# Patient Record
Sex: Female | Born: 1985 | Race: White | Hispanic: No | Marital: Single | Smoking: Current every day smoker
Health system: Southern US, Community
[De-identification: ages and names within clinical notes are randomized; demographics above are authoritative.]

## PROBLEM LIST (undated history)

## (undated) DIAGNOSIS — J45909 Unspecified asthma, uncomplicated: Secondary | ICD-10-CM

## (undated) DIAGNOSIS — I82409 Acute embolism and thrombosis of unspecified deep veins of unspecified lower extremity: Secondary | ICD-10-CM

## (undated) DIAGNOSIS — I10 Essential (primary) hypertension: Secondary | ICD-10-CM

## (undated) DIAGNOSIS — I2699 Other pulmonary embolism without acute cor pulmonale: Secondary | ICD-10-CM

## (undated) HISTORY — PX: OTHER SURGICAL HISTORY: SHX169

---

## 2014-10-10 ENCOUNTER — Emergency Department (HOSPITAL_BASED_OUTPATIENT_CLINIC_OR_DEPARTMENT_OTHER)
Admission: EM | Admit: 2014-10-10 | Discharge: 2014-10-11 | Disposition: A | Discharge status: Home / Self Care | Payer: Medicaid Other | Attending: Emergency Medicine | Admitting: Emergency Medicine

## 2014-10-10 ENCOUNTER — Encounter (HOSPITAL_BASED_OUTPATIENT_CLINIC_OR_DEPARTMENT_OTHER): Payer: Self-pay | Admitting: Emergency Medicine

## 2014-10-10 ENCOUNTER — Emergency Department (HOSPITAL_BASED_OUTPATIENT_CLINIC_OR_DEPARTMENT_OTHER): Payer: Medicaid Other

## 2014-10-10 DIAGNOSIS — Y9389 Activity, other specified: Secondary | ICD-10-CM | POA: Diagnosis not present

## 2014-10-10 DIAGNOSIS — Y92480 Sidewalk as the place of occurrence of the external cause: Secondary | ICD-10-CM | POA: Diagnosis not present

## 2014-10-10 DIAGNOSIS — I10 Essential (primary) hypertension: Secondary | ICD-10-CM | POA: Insufficient documentation

## 2014-10-10 DIAGNOSIS — M7989 Other specified soft tissue disorders: Secondary | ICD-10-CM

## 2014-10-10 DIAGNOSIS — Y998 Other external cause status: Secondary | ICD-10-CM | POA: Diagnosis not present

## 2014-10-10 DIAGNOSIS — S93401A Sprain of unspecified ligament of right ankle, initial encounter: Secondary | ICD-10-CM

## 2014-10-10 DIAGNOSIS — S99911A Unspecified injury of right ankle, initial encounter: Secondary | ICD-10-CM | POA: Diagnosis present

## 2014-10-10 DIAGNOSIS — Z8781 Personal history of (healed) traumatic fracture: Secondary | ICD-10-CM | POA: Insufficient documentation

## 2014-10-10 DIAGNOSIS — W101XXA Fall (on)(from) sidewalk curb, initial encounter: Secondary | ICD-10-CM | POA: Diagnosis not present

## 2014-10-10 DIAGNOSIS — S92351A Displaced fracture of fifth metatarsal bone, right foot, initial encounter for closed fracture: Secondary | ICD-10-CM | POA: Diagnosis not present

## 2014-10-10 DIAGNOSIS — S92301A Fracture of unspecified metatarsal bone(s), right foot, initial encounter for closed fracture: Secondary | ICD-10-CM

## 2014-10-10 DIAGNOSIS — Z720 Tobacco use: Secondary | ICD-10-CM | POA: Insufficient documentation

## 2014-10-10 DIAGNOSIS — J45909 Unspecified asthma, uncomplicated: Secondary | ICD-10-CM | POA: Diagnosis not present

## 2014-10-10 DIAGNOSIS — M79606 Pain in leg, unspecified: Secondary | ICD-10-CM

## 2014-10-10 HISTORY — DX: Essential (primary) hypertension: I10

## 2014-10-10 HISTORY — DX: Unspecified asthma, uncomplicated: J45.909

## 2014-10-10 NOTE — ED Notes (Signed)
Stepped off curb and twisted rt ankle  Increased swelling

## 2014-10-10 NOTE — ED Notes (Signed)
28 yo c/o Right ankle pain after stepping over curb and fall. Mild swelling is noted. Pain 8/10. No deformities with strong pulse. HX right ankle fracture x4

## 2014-10-11 MED ORDER — HYDROCODONE-ACETAMINOPHEN 5-325 MG PO TABS
1.0000 | ORAL_TABLET | Freq: Once | ORAL | Status: AC
  Administered 2014-10-11: 1 via ORAL
  Filled 2014-10-11: qty 1

## 2014-10-11 MED ORDER — HYDROCODONE-ACETAMINOPHEN 5-325 MG PO TABS: 1.0000 | ORAL_TABLET | Freq: Four times a day (QID) | ORAL | Status: AC | PRN

## 2014-10-11 NOTE — ED Provider Notes (Addendum)
CSN: [id number]     Arrival date & time 10/10/14  2209 History   First MD Initiated Contact with Patient 10/11/14 0033     Chief Complaint  Patient presents with  . Ankle Injury     (Consider location/radiation/quality/duration/timing/severity/associated sxs/prior Treatment) HPI This is a 28 year old female who twisted her ankle stepping off a curb yesterday evening not long prior to arrival. Subsequent to the fall she is having pain in her right lateral ankle and pain over the right fifth metatarsal. She rates her pain an 8 out of 10. She is unable to bear weight on that extremity. There is associated swelling over the lateral malleolus and ecchymosis there and over the fifth metatarsal. She denies other injury. Pain is worse with attempted weightbearing, palpation or movement. She is having numbness in the right fourth and fifth toes.  Past Medical History  Diagnosis Date  . Asthma   . Hypertension    Past Surgical History  Procedure Laterality Date  . Ankle fracture     No family history on file. History  Substance Use Topics  . Smoking status: Current Every Day Smoker -- 0.50 packs/day    Types: Cigarettes  . Smokeless tobacco: Not on file  . Alcohol Use: Yes   OB History    No data available     Review of Systems  All other systems reviewed and are negative.   Allergies  Review of patient's allergies indicates no known allergies.  Home Medications   Prior to Admission medications   Not on File   BP 127/85 mmHg  Pulse 99  Temp(Src) 98.4 F (36.9 C) (Oral)  Resp 18  Ht 5\' 3"  (1.6 m)  Wt 196 lb (88.905 kg)  BMI 34.73 kg/m2  SpO2 98%  LMP 09/18/2014 (Approximate)   Physical Exam  General: Well-developed, well-nourished female in no acute distress; appearance consistent with age of record HENT: normocephalic; atraumatic Eyes: pupils equal, round and reactive to light; extraocular muscles intact Neck: supple Heart: regular rate and rhythm Lungs: clear to  auscultation bilaterally Abdomen: soft; nondistended; nontender Extremities: No deformity; pulses normal; swelling, ecchymosis and tenderness over right lateral malleolus with limited range of motion due to pain; ecchymosis and tenderness over her right fifth metatarsal; tendon function intact in toes distally; sensation decreased over right fourth and fifth toes; capillary refill brisk in the toes of the right foot Neurologic: Awake, alert and oriented; motor function intact in all extremities and symmetric; no facial droop Skin: Warm and dry; superficial abrasion of the anterior right ankle Psychiatric: Normal mood and affect    ED Course  Procedures (including critical care time)   MDM  Nursing notes and vitals signs, including pulse oximetry, reviewed.  Summary of this visit's results, reviewed by myself:  Imaging Studies: Dg Ankle Complete Right  10/10/2014   CLINICAL DATA:  RIGHT ankle pain post fall tonight, pain and swelling at lateral malleolus  EXAM: RIGHT ANKLE - COMPLETE 3+ VIEW  COMPARISON:  None  FINDINGS: Soft tissue swelling greatest laterally.  Osseous mineralization normal.  Ankle mortise intact.  Corticated ossicle identified inferior to the lateral malleolus, appears old.  No definite acute fracture, dislocation, or bone destruction.  IMPRESSION: No definite acute osseous abnormalities.   Electronically Signed   By: Ulyses SouthwardMark  Boles M.D.   On: 10/10/2014 23:16   Dg Foot Complete Right  10/10/2014   CLINICAL DATA:  Right foot pain after a fall tonight. Pain is most on the lateral side. Swelling.  EXAM: RIGHT FOOT COMPLETE - 3+ VIEW  COMPARISON:  None.  FINDINGS: Oblique fracture of the mid/distal shaft of the right fifth metatarsal bone. No significant displacement. Mild medial angulation of the distal fracture fragment. Soft tissue swelling.  IMPRESSION: Fracture right fifth metatarsal bone.   Electronically Signed   By: Burman NievesWilliam  Stevens M.D.   On: 10/10/2014 23:12        Hanley SeamenJohn L Nakyiah Kuck, MD 10/11/14 0046  Hanley SeamenJohn L Xandria Gallaga, MD 10/11/14 [id number]

## 2014-12-01 ENCOUNTER — Encounter (HOSPITAL_BASED_OUTPATIENT_CLINIC_OR_DEPARTMENT_OTHER): Payer: Self-pay | Admitting: *Deleted

## 2014-12-01 ENCOUNTER — Emergency Department (HOSPITAL_BASED_OUTPATIENT_CLINIC_OR_DEPARTMENT_OTHER)
Admission: EM | Admit: 2014-12-01 | Discharge: 2014-12-02 | Disposition: A | Discharge status: Home / Self Care | Payer: Medicaid Other | Attending: Emergency Medicine | Admitting: Emergency Medicine

## 2014-12-01 DIAGNOSIS — A599 Trichomoniasis, unspecified: Secondary | ICD-10-CM

## 2014-12-01 DIAGNOSIS — Z7901 Long term (current) use of anticoagulants: Secondary | ICD-10-CM | POA: Insufficient documentation

## 2014-12-01 DIAGNOSIS — R110 Nausea: Secondary | ICD-10-CM | POA: Insufficient documentation

## 2014-12-01 DIAGNOSIS — Z86718 Personal history of other venous thrombosis and embolism: Secondary | ICD-10-CM | POA: Insufficient documentation

## 2014-12-01 DIAGNOSIS — I10 Essential (primary) hypertension: Secondary | ICD-10-CM | POA: Insufficient documentation

## 2014-12-01 DIAGNOSIS — Z720 Tobacco use: Secondary | ICD-10-CM | POA: Diagnosis not present

## 2014-12-01 DIAGNOSIS — J45909 Unspecified asthma, uncomplicated: Secondary | ICD-10-CM | POA: Insufficient documentation

## 2014-12-01 DIAGNOSIS — A5900 Urogenital trichomoniasis, unspecified: Secondary | ICD-10-CM | POA: Insufficient documentation

## 2014-12-01 DIAGNOSIS — Z3202 Encounter for pregnancy test, result negative: Secondary | ICD-10-CM | POA: Diagnosis not present

## 2014-12-01 DIAGNOSIS — R1013 Epigastric pain: Secondary | ICD-10-CM

## 2014-12-01 HISTORY — DX: Acute embolism and thrombosis of unspecified deep veins of unspecified lower extremity: I82.409

## 2014-12-01 HISTORY — DX: Other pulmonary embolism without acute cor pulmonale: I26.99

## 2014-12-01 LAB — URINALYSIS, ROUTINE W REFLEX MICROSCOPIC
BILIRUBIN URINE: NEGATIVE
Glucose, UA: NEGATIVE mg/dL
Ketones, ur: NEGATIVE mg/dL
NITRITE: NEGATIVE
Protein, ur: NEGATIVE mg/dL
Specific Gravity, Urine: 1.007 (ref 1.005–1.030)
Urobilinogen, UA: 1 mg/dL (ref 0.0–1.0)
pH: 6.5 (ref 5.0–8.0)

## 2014-12-01 LAB — CBC WITH DIFFERENTIAL/PLATELET
Basophils Absolute: 0 10*3/uL (ref 0.0–0.1)
Basophils Relative: 0 % (ref 0–1)
Eosinophils Absolute: 0.2 10*3/uL (ref 0.0–0.7)
Eosinophils Relative: 2 % (ref 0–5)
HCT: 41 % (ref 36.0–46.0)
Hemoglobin: 13.4 g/dL (ref 12.0–15.0)
Lymphocytes Relative: 34 % (ref 12–46)
Lymphs Abs: 3.5 10*3/uL (ref 0.7–4.0)
MCH: 31.3 pg (ref 26.0–34.0)
MCHC: 32.7 g/dL (ref 30.0–36.0)
MCV: 95.8 fL (ref 78.0–100.0)
MONO ABS: 0.9 10*3/uL (ref 0.1–1.0)
Monocytes Relative: 8 % (ref 3–12)
NEUTROS ABS: 5.8 10*3/uL (ref 1.7–7.7)
NEUTROS PCT: 56 % (ref 43–77)
Platelets: 329 10*3/uL (ref 150–400)
RBC: 4.28 MIL/uL (ref 3.87–5.11)
RDW: 14.6 % (ref 11.5–15.5)
WBC: 10.4 10*3/uL (ref 4.0–10.5)

## 2014-12-01 LAB — COMPREHENSIVE METABOLIC PANEL
ALT: 23 U/L (ref 0–35)
AST: 19 U/L (ref 0–37)
Albumin: 4.4 g/dL (ref 3.5–5.2)
Alkaline Phosphatase: 56 U/L (ref 39–117)
Anion gap: <3 — ABNORMAL LOW (ref 5–15)
BUN: 9 mg/dL (ref 6–23)
CHLORIDE: 110 mmol/L (ref 96–112)
CO2: 26 mmol/L (ref 19–32)
CREATININE: 0.64 mg/dL (ref 0.50–1.10)
Calcium: 9 mg/dL (ref 8.4–10.5)
GFR calc Af Amer: >90 mL/min (ref >90)
GLUCOSE: 98 mg/dL (ref 70–99)
Potassium: 3.5 mmol/L (ref 3.5–5.1)
Sodium: 138 mmol/L (ref 135–145)
Total Bilirubin: 0.4 mg/dL (ref 0.3–1.2)
Total Protein: 7.7 g/dL (ref 6.0–8.3)

## 2014-12-01 LAB — URINE MICROSCOPIC-ADD ON

## 2014-12-01 LAB — LIPASE, BLOOD: Lipase: 22 U/L (ref 11–59)

## 2014-12-01 LAB — PREGNANCY, URINE: Preg Test, Ur: NEGATIVE

## 2014-12-01 MED ORDER — GI COCKTAIL ~~LOC~~
30.0000 mL | Freq: Once | ORAL | Status: AC
  Administered 2014-12-01: 30 mL via ORAL
  Filled 2014-12-01: qty 30

## 2014-12-01 MED ORDER — OMEPRAZOLE 20 MG PO CPDR: 20.0000 mg | DELAYED_RELEASE_CAPSULE | Freq: Every day | ORAL | Status: AC

## 2014-12-01 MED ORDER — CEFTRIAXONE SODIUM 250 MG IJ SOLR
250.0000 mg | Freq: Once | INTRAMUSCULAR | Status: AC
  Administered 2014-12-02: 250 mg via INTRAMUSCULAR
  Filled 2014-12-01: qty 250

## 2014-12-01 MED ORDER — AZITHROMYCIN 250 MG PO TABS
1000.0000 mg | ORAL_TABLET | Freq: Once | ORAL | Status: AC
  Administered 2014-12-02: 1000 mg via ORAL
  Filled 2014-12-01: qty 4

## 2014-12-01 MED ORDER — METRONIDAZOLE 500 MG PO TABS: 500.0000 mg | ORAL_TABLET | Freq: Two times a day (BID) | ORAL | Status: AC

## 2014-12-01 NOTE — ED Notes (Signed)
Patient states she is having abd pain that started today, in the middle part of her abd.

## 2014-12-01 NOTE — ED Provider Notes (Signed)
CSN: [id number]     Arrival date & time 12/01/14  1837 History  This chart was scribed for Morgan RoofJohn David Jandiel Magallanes III, MD by Gwenyth Oberatherine Macek, ED Scribe. This patient was seen in room MH08/MH08 and the patient's care was started at 9:03 PM.    Chief Complaint  Patient presents with  . Abdominal Pain   Patient is a 29 y.o. female presenting with abdominal pain. The history is provided by the patient. No language interpreter was used.  Abdominal Pain Pain location:  Epigastric Pain quality: sharp   Pain radiates to:  LLQ and LUQ Pain severity:  Moderate Onset quality:  Gradual Duration:  1 day Timing:  Constant Progression:  Unchanged Chronicity:  New Relieved by:  None tried Worsened by:  Nothing tried Ineffective treatments:  None tried Associated symptoms: nausea and vaginal bleeding   Associated symptoms: no constipation, no dysuria, no fever, no hematuria and no vaginal discharge     HPI Comments: Morgan Thomas is a 29 y.o. female who presents to the Emergency Department complaining of constant, sharp, stabbing upper central abdominal pain that radiates to her left abdomen and started today. She states nausea and vaginal bleeding as associated symptoms. Pt has a history of similar symptoms that were previously diagnosed as diverticulitis. Her last BM was this morning. Pt has a history of DVT and takes Xarelto daily. She denies fever, urinary symptoms, constipation and vaginal discharge as associated symptoms.  Past Medical History  Diagnosis Date  . Asthma   . Hypertension   . DVT (deep venous thrombosis)   . Pulmonary thrombosis    Past Surgical History  Procedure Laterality Date  . Ankle fracture     No family history on file. History  Substance Use Topics  . Smoking status: Current Every Day Smoker -- 0.50 packs/day    Types: Cigarettes  . Smokeless tobacco: Not on file  . Alcohol Use: Yes   OB History    No data available     Review of Systems  Constitutional:  Negative for fever.  Gastrointestinal: Positive for nausea and abdominal pain. Negative for constipation.  Genitourinary: Positive for vaginal bleeding. Negative for dysuria, hematuria and vaginal discharge.  All other systems reviewed and are negative.  Allergies  Review of patient's allergies indicates no known allergies.  Home Medications   Prior to Admission medications   Medication Sig Start Date End Date Taking? Authorizing Provider  rivaroxaban (XARELTO) 20 MG TABS tablet Take 20 mg by mouth daily with supper.   Yes Historical Provider, MD  HYDROcodone-acetaminophen (NORCO/VICODIN) 5-325 MG per tablet Take 1-2 tablets by mouth every 6 (six) hours as needed (for pain). 10/11/14   Terrin Meddaugh L Molpus, MD   BP 139/99 mmHg  Pulse 81  Temp(Src) 97.9 F (36.6 C) (Oral)  Resp 18  Ht 5\' 3"  (1.6 m)  Wt 196 lb (88.905 kg)  BMI 34.73 kg/m2  SpO2 100%  LMP 11/30/2014 Physical Exam  Constitutional: She is oriented to person, place, and time. She appears well-developed and well-nourished. No distress.  HENT:  Head: Normocephalic and atraumatic.  Mouth/Throat: Oropharynx is clear and moist.  Eyes: Conjunctivae are normal. Pupils are equal, round, and reactive to light. No scleral icterus.  Neck: Neck supple.  Cardiovascular: Normal rate, regular rhythm, normal heart sounds and intact distal pulses.   No murmur heard. Pulmonary/Chest: Effort normal and breath sounds normal. No stridor. No respiratory distress. She has no rales.  Abdominal: Soft. Bowel sounds are normal. She exhibits no  distension. There is no tenderness.  Genitourinary: There is no rash or lesion on the right labia. There is no rash or lesion on the left labia. Uterus is enlarged. Uterus is not tender. Cervix exhibits no motion tenderness, no discharge and no friability. Right adnexum displays no mass, no tenderness and no fullness. Left adnexum displays no mass, no tenderness and no fullness. There is bleeding in the vagina.   Musculoskeletal: Normal range of motion.  Neurological: She is alert and oriented to person, place, and time.  Skin: Skin is warm and dry. No rash noted.  Psychiatric: She has a normal mood and affect. Her behavior is normal.  Nursing note and vitals reviewed.   ED Course  Procedures (including critical care time) DIAGNOSTIC STUDIES: Oxygen Saturation is 100% on RA, normal by my interpretation.    COORDINATION OF CARE: 9:10 PM Discussed treatment plan with pt at bedside and pt agreed to plan.   Labs Review Labs Reviewed  URINALYSIS, ROUTINE W REFLEX MICROSCOPIC - Abnormal; Notable for the following:    Hgb urine dipstick LARGE (*)    Leukocytes, UA MODERATE (*)    All other components within normal limits  URINE MICROSCOPIC-ADD ON - Abnormal; Notable for the following:    Squamous Epithelial / LPF FEW (*)    Bacteria, UA FEW (*)    All other components within normal limits  COMPREHENSIVE METABOLIC PANEL - Abnormal; Notable for the following:    Anion gap <3 (*)    All other components within normal limits  WET PREP, GENITAL  PREGNANCY, URINE  CBC WITH DIFFERENTIAL/PLATELET  LIPASE, BLOOD  GC/CHLAMYDIA PROBE AMP ([place])    Imaging Review No results found.   EKG Interpretation None      MDM   Final diagnoses:  Epigastric abdominal pain  Trichomonas vaginalis infection    29 year old female presented with epigastric abdominal pain. Lipase normal,LFTsnormal.well-appearing and nontoxic. Abdomen soft but tender in epigastrium and left upper quadrant. No significant right upper quadrant tenderness. No significant lower abdominal tenderness. she was found to have Trichomonas on her UA.Trichomonas or potential concomitant STI does not appear to be the source of her epigastric abdominal pain. Will give GI cocktail and recommend PPI. I don't think she needs abdominal imaging based on her history,appearance, labs, and exam.  I advised safe sex and avoiding any sex until  she finishes treatment partner is treated. Plan to also treat empirically for GC and chlamydia.  I personally performed the services described in this documentation, which was scribed in my presence. The recorded information has been reviewed and is accurate.     Morgan Roof, MD 12/01/14 [id number]

## 2014-12-01 NOTE — Discharge Instructions (Signed)
Abdominal Pain, Women °Abdominal (stomach, pelvic, or belly) pain can be caused by many things. It is important to tell your doctor: °· The location of the pain. °· Does it come and go or is it present all the time? °· Are there things that start the pain (eating certain foods, exercise)? °· Are there other symptoms associated with the pain (fever, nausea, vomiting, diarrhea)? °All of this is helpful to know when trying to find the cause of the pain. °CAUSES  °· Stomach: virus or bacteria infection, or ulcer. °· Intestine: appendicitis (inflamed appendix), regional ileitis (Crohn's disease), ulcerative colitis (inflamed colon), irritable bowel syndrome, diverticulitis (inflamed diverticulum of the colon), or cancer of the stomach or intestine. °· Gallbladder disease or stones in the gallbladder. °· Kidney disease, kidney stones, or infection. °· Pancreas infection or cancer. °· Fibromyalgia (pain disorder). °· Diseases of the female organs: °¨ Uterus: fibroid (non-cancerous) tumors or infection. °¨ Fallopian tubes: infection or tubal pregnancy. °¨ Ovary: cysts or tumors. °¨ Pelvic adhesions (scar tissue). °¨ Endometriosis (uterus lining tissue growing in the pelvis and on the pelvic organs). °¨ Pelvic congestion syndrome (female organs filling up with blood just before the menstrual period). °¨ Pain with the menstrual period. °¨ Pain with ovulation (producing an egg). °¨ Pain with an IUD (intrauterine device, birth control) in the uterus. °¨ Cancer of the female organs. °· Functional pain (pain not caused by a disease, may improve without treatment). °· Psychological pain. °· Depression. °DIAGNOSIS  °Your doctor will decide the seriousness of your pain by doing an examination. °· Blood tests. °· X-rays. °· Ultrasound. °· CT scan (computed tomography, special type of X-ray). °· MRI (magnetic resonance imaging). °· Cultures, for infection. °· Barium enema (dye inserted in the large intestine, to better view it with  X-rays). °· Colonoscopy (looking in intestine with a lighted tube). °· Laparoscopy (minor surgery, looking in abdomen with a lighted tube). °· Major abdominal exploratory surgery (looking in abdomen with a large incision). °TREATMENT  °The treatment will depend on the cause of the pain.  °· Many cases can be observed and treated at home. °· Over-the-counter medicines recommended by your caregiver. °· Prescription medicine. °· Antibiotics, for infection. °· Birth control pills, for painful periods or for ovulation pain. °· Hormone treatment, for endometriosis. °· Nerve blocking injections. °· Physical therapy. °· Antidepressants. °· Counseling with a psychologist or psychiatrist. °· Minor or major surgery. °HOME CARE INSTRUCTIONS  °· Do not take laxatives, unless directed by your caregiver. °· Take over-the-counter pain medicine only if ordered by your caregiver. Do not take aspirin because it can cause an upset stomach or bleeding. °· Try a clear liquid diet (broth or water) as ordered by your caregiver. Slowly move to a bland diet, as tolerated, if the pain is related to the stomach or intestine. °· Have a thermometer and take your temperature several times a day, and record it. °· Bed rest and sleep, if it helps the pain. °· Avoid sexual intercourse, if it causes pain. °· Avoid stressful situations. °· Keep your follow-up appointments and tests, as your caregiver orders. °· If the pain does not go away with medicine or surgery, you may try: °¨ Acupuncture. °¨ Relaxation exercises (yoga, meditation). °¨ Group therapy. °¨ Counseling. °SEEK MEDICAL CARE IF:  °· You notice certain foods cause stomach pain. °· Your home care treatment is not helping your pain. °· You need stronger pain medicine. °· You want your IUD removed. °· You feel faint or   lightheaded. °· You develop nausea and vomiting. °· You develop a rash. °· You are having side effects or an allergy to your medicine. °SEEK IMMEDIATE MEDICAL CARE IF:  °· Your  pain does not go away or gets worse. °· You have a fever. °· Your pain is felt only in portions of the abdomen. The right side could possibly be appendicitis. The left lower portion of the abdomen could be colitis or diverticulitis. °· You are passing blood in your stools (bright red or black tarry stools, with or without vomiting). °· You have blood in your urine. °· You develop chills, with or without a fever. °· You pass out. °MAKE SURE YOU:  °· Understand these instructions. °· Will watch your condition. °· Will get help right away if you are not doing well or get worse. °Document Released: 08/15/2007 Document Revised: 03/04/2014 Document Reviewed: 09/04/2009 °[employer]® Patient Information ©2015 [employer]. This information is not intended to replace advice given to you by your health care provider. Make sure you discuss any questions you have with your health care provider. ° °

## 2014-12-01 NOTE — ED Notes (Addendum)
Abdominal pain unchanged no signs or symptoms of immediate distress condition stable at this time HR 76 RR 16 O2 sat 99% alert x4

## 2014-12-01 NOTE — ED Notes (Signed)
MD at bedside. 

## 2014-12-01 NOTE — ED Notes (Signed)
Pt asked to change into gown. 

## 2014-12-02 LAB — WET PREP, GENITAL: YEAST WET PREP: NONE SEEN

## 2014-12-02 LAB — GC/CHLAMYDIA PROBE AMP (~~LOC~~) NOT AT ARMC
Chlamydia: NEGATIVE
NEISSERIA GONORRHEA: NEGATIVE

## 2014-12-02 MED ORDER — LIDOCAINE HCL (PF) 1 % IJ SOLN
INTRAMUSCULAR | Status: AC
  Administered 2014-12-02: 1.5 mL
  Filled 2014-12-02: qty 5

## 2014-12-02 MED ORDER — AZITHROMYCIN 250 MG PO TABS
ORAL_TABLET | ORAL | Status: AC
  Filled 2014-12-02: qty 1

## 2016-01-11 IMAGING — CR DG FOOT COMPLETE 3+V*R*
3 series · 3 of 3 positions shown · non-contrast
Comparison: None.

CLINICAL DATA: Right foot pain after a fall tonight. Pain is most
on the lateral side. Swelling.

EXAM:
RIGHT FOOT COMPLETE - 3+ VIEW

[t foot lat right]
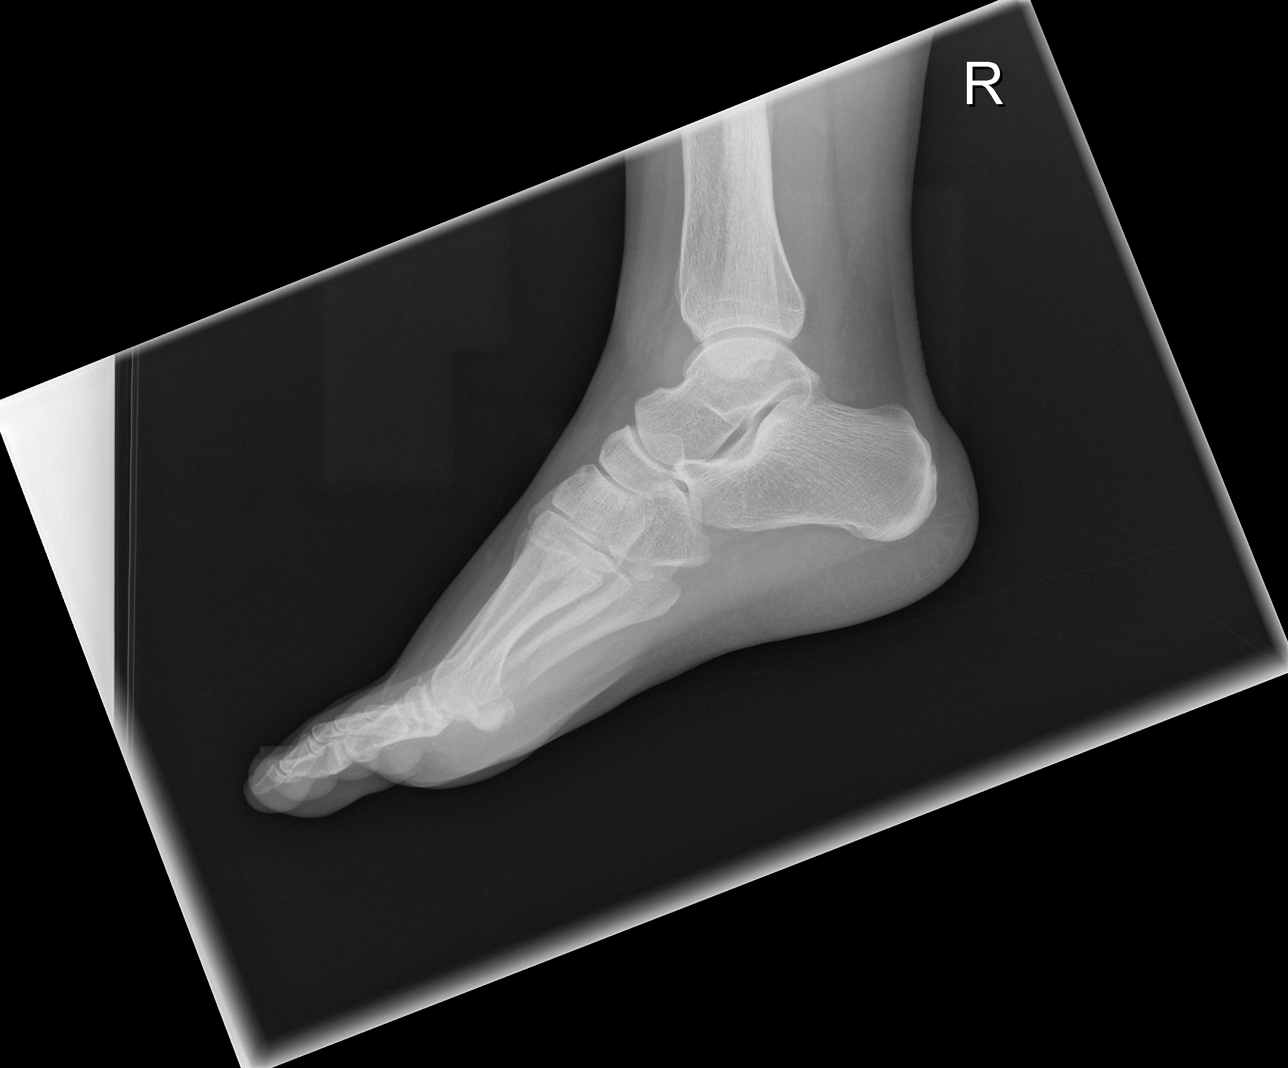

[t foot ap right]
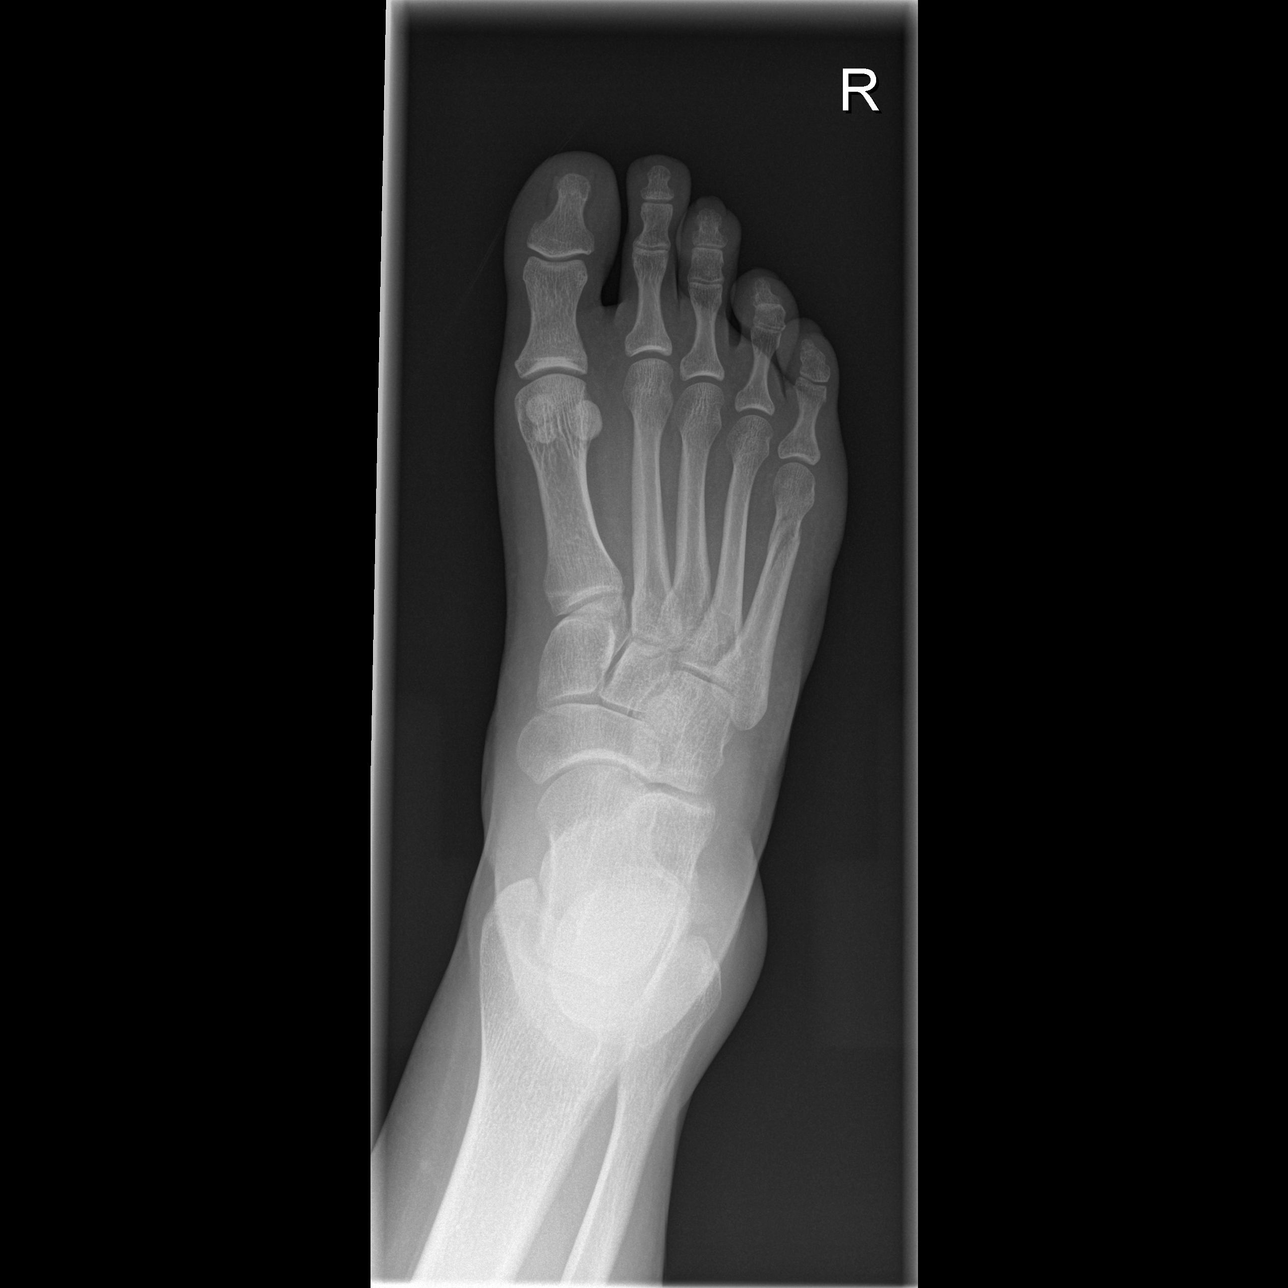

[t foot oblique right]
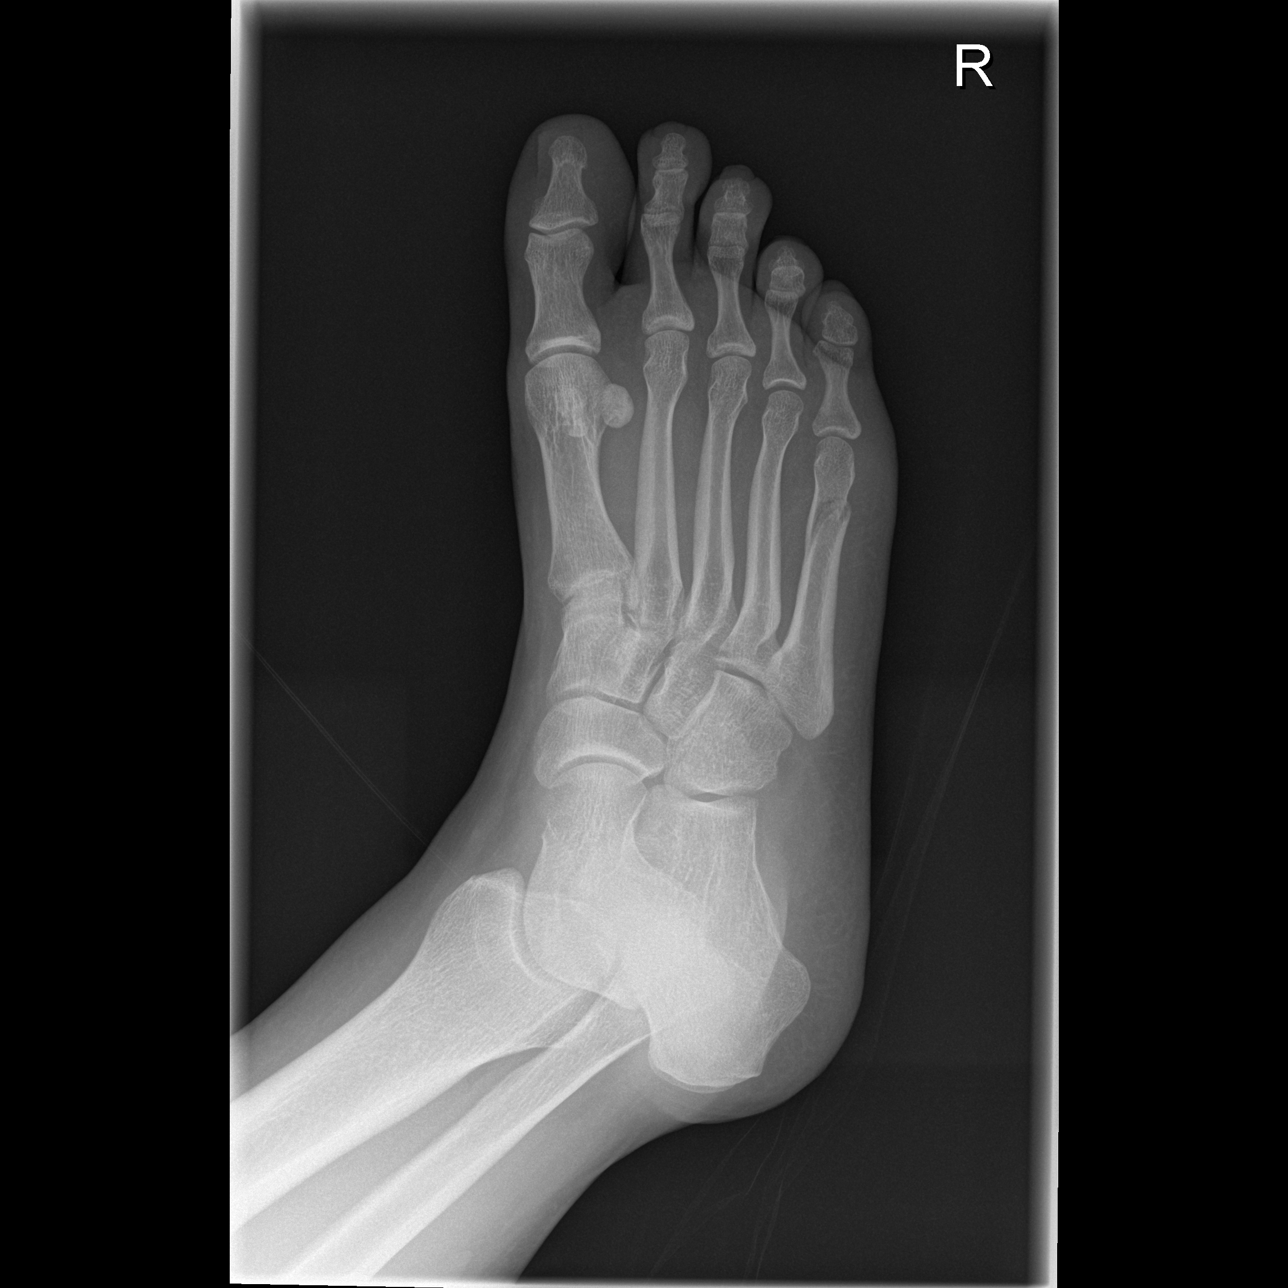

[3 of 3 positions shown; findings below may reference images not displayed]

FINDINGS: Oblique fracture of the mid/distal shaft of the right fifth
metatarsal bone. No significant displacement. Mild medial angulation
of the distal fracture fragment. Soft tissue swelling.
IMPRESSION: Fracture right fifth metatarsal bone.

## 2020-07-02 DEATH — deceased
